# Patient Record
Sex: Male | Born: 1985 | Race: White | Hispanic: No | Marital: Married | Smoking: Never smoker
Health system: Southern US, Community
[De-identification: ages and names within clinical notes are randomized; demographics above are authoritative.]

---

## 2018-08-09 ENCOUNTER — Other Ambulatory Visit: Payer: Self-pay

## 2018-08-09 ENCOUNTER — Emergency Department (HOSPITAL_BASED_OUTPATIENT_CLINIC_OR_DEPARTMENT_OTHER): Payer: 59

## 2018-08-09 ENCOUNTER — Encounter (HOSPITAL_BASED_OUTPATIENT_CLINIC_OR_DEPARTMENT_OTHER): Payer: Self-pay | Admitting: *Deleted

## 2018-08-09 ENCOUNTER — Emergency Department (HOSPITAL_BASED_OUTPATIENT_CLINIC_OR_DEPARTMENT_OTHER)
Admission: EM | Admit: 2018-08-09 | Discharge: 2018-08-10 | Disposition: A | Discharge status: Home / Self Care | Payer: 59 | Attending: Emergency Medicine | Admitting: Emergency Medicine

## 2018-08-09 DIAGNOSIS — Y998 Other external cause status: Secondary | ICD-10-CM | POA: Diagnosis not present

## 2018-08-09 DIAGNOSIS — Y93H3 Activity, building and construction: Secondary | ICD-10-CM | POA: Diagnosis not present

## 2018-08-09 DIAGNOSIS — S61213A Laceration without foreign body of left middle finger without damage to nail, initial encounter: Secondary | ICD-10-CM | POA: Diagnosis present

## 2018-08-09 DIAGNOSIS — Z23 Encounter for immunization: Secondary | ICD-10-CM | POA: Insufficient documentation

## 2018-08-09 DIAGNOSIS — W458XXA Other foreign body or object entering through skin, initial encounter: Secondary | ICD-10-CM | POA: Insufficient documentation

## 2018-08-09 DIAGNOSIS — Y929 Unspecified place or not applicable: Secondary | ICD-10-CM | POA: Insufficient documentation

## 2018-08-09 DIAGNOSIS — Y999 Unspecified external cause status: Secondary | ICD-10-CM | POA: Insufficient documentation

## 2018-08-09 MED ORDER — LIDOCAINE HCL 2 % IJ SOLN
10.0000 mL | Freq: Once | INTRAMUSCULAR | Status: AC
  Administered 2018-08-10: 200 mg
  Filled 2018-08-09: qty 20

## 2018-08-09 NOTE — ED Provider Notes (Signed)
[place] EMERGENCY DEPARTMENT Provider Note   CSN: [id number] Arrival date & time: 08/09/18  2151    History   Chief Complaint Chief Complaint  Patient presents with  . Finger Injury    HPI Joseph Herman is a 33 y.o. male.     Patient presents to the emergency department for evaluation of left middle finger injury.  Patient reports that he screwed a deck screw through a piece of wood and it came out the back and into the tip of his middle finger.  Patient complains of bleeding that was controlled with direct pressure and mild pain.  No numbness, tingling or difficulty moving the finger.     History reviewed. No pertinent past medical history.  There are no active problems to display for this patient.   History reviewed. No pertinent surgical history.      Home Medications    Prior to Admission medications   Not on File    Family History No family history on file.  Social History Social History   Tobacco Use  . Smoking status: Never Smoker  . Smokeless tobacco: Never Used  Substance Use Topics  . Alcohol use: Not Currently  . Drug use: Never     Allergies   Patient has no known allergies.   Review of Systems Review of Systems  Skin: Positive for wound.  Neurological: Negative.      Physical Exam Updated Vital Signs BP 139/78   Pulse 80   Temp 98.5 F (36.9 C) (Oral)   Resp 14   Ht 5\' 10"  (1.778 m)   Wt 79.4 kg   SpO2 98%   BMI 25.11 kg/m   Physical Exam Constitutional:      Appearance: Normal appearance.  Musculoskeletal:     Left hand: He exhibits tenderness (Distal middle finger) and laceration (Distal middle finger). He exhibits normal range of motion and normal capillary refill. Normal sensation noted. Normal strength noted.  Skin:    Capillary Refill: Capillary refill takes less than 2 seconds.     Findings: Laceration present. No erythema.  Neurological:     Mental Status: He is alert.     Sensory:  Sensation is intact.     Motor: Motor function is intact.      ED Treatments / Results  Labs (all labs ordered are listed, but only abnormal results are displayed) Labs Reviewed - No data to display  EKG None  Radiology Dg Finger Middle Left  Result Date: 08/09/2018 CLINICAL DATA:  Injury with screw EXAM: LEFT MIDDLE FINGER 2+V COMPARISON:  None. FINDINGS: No fracture or dislocation is seen. The joint spaces are preserved. Soft tissue injury/laceration along the ventral aspect of the distal 1st digit. No radiopaque foreign body is seen. IMPRESSION: Soft tissue injury/laceration along the distal 1st digit. No fracture, dislocation, or radiopaque foreign body is seen. Electronically Signed   By: Julian Hy M.D.   On: 08/09/2018 22:36    Procedures .Marland KitchenLaceration Repair  Date/Time: 08/10/2018 12:50 AM Performed by: Orpah Greek, MD Authorized by: Orpah Greek, MD   Consent:    Consent obtained:  Verbal   Consent given by:  Patient   Risks discussed:  Infection, pain and poor wound healing Universal protocol:    Procedure explained and questions answered to patient or proxy's satisfaction: yes     Site/side marked: yes     Immediately prior to procedure, a time out was called: yes     Patient identity confirmed:  Verbally with patient Anesthesia (see MAR for exact dosages):    Anesthesia method:  Local infiltration   Local anesthetic:  Lidocaine 2% w/o epi Laceration details:    Location:  Finger   Finger location:  L long finger   Length (cm):  1 Repair type:    Repair type:  Simple Exploration:    Hemostasis achieved with:  Direct pressure   Wound exploration: wound explored through full range of motion     Contaminated: no   Treatment:    Area cleansed with:  Betadine   Irrigation solution:  Sterile saline   Irrigation method:  Syringe Skin repair:    Repair method:  Sutures   Suture size:  4-0   Suture material:  Nylon   Number of  sutures:  4 Approximation:    Approximation:  Close Post-procedure details:    Dressing:  Open (no dressing)   Patient tolerance of procedure:  Tolerated well, no immediate complications   (including critical care time)  Medications Ordered in ED Medications  lidocaine (XYLOCAINE) 2 % (with pres) injection 200 mg (has no administration in time range)     Initial Impression / Assessment and Plan / ED Course  I have reviewed the triage vital signs and the nursing notes.  Pertinent labs & imaging results that were available during my care of the patient were reviewed by me and considered in my medical decision making (see chart for details).          Final Clinical Impressions(s) / ED Diagnoses   Final diagnoses:  Laceration of left middle finger without foreign body without damage to nail, initial encounter    ED Discharge Orders    None       , Canary Brimhristopher J, MD 08/10/18 [phone]

## 2018-08-09 NOTE — ED Triage Notes (Signed)
He drilled a deck screw into the tip of his left middle finger. Bleeding controlled.

## 2018-08-10 MED ORDER — TETANUS-DIPHTH-ACELL PERTUSSIS 5-2.5-18.5 LF-MCG/0.5 IM SUSP
0.5000 mL | Freq: Once | INTRAMUSCULAR | Status: AC
  Administered 2018-08-10: 0.5 mL via INTRAMUSCULAR
  Filled 2018-08-10: qty 0.5

## 2018-08-10 NOTE — ED Notes (Signed)
Lidocaine and suture cart at bedside. Provider aware.

## 2018-08-10 NOTE — ED Notes (Signed)
Provider at bedside

## 2018-08-10 NOTE — Discharge Instructions (Addendum)
Sutures need to be removed in 7 to 10 days.  Follow-up with your primary doctor or urgent care for removal.  Watch for signs of infection, if they occur return to ER.

## 2020-04-29 IMAGING — DX LEFT MIDDLE FINGER 2+V
3 series · 3 of 3 positions shown · non-contrast
Comparison: None.

CLINICAL DATA: Injury with screw

EXAM:
LEFT MIDDLE FINGER 2+V

[finger ap]
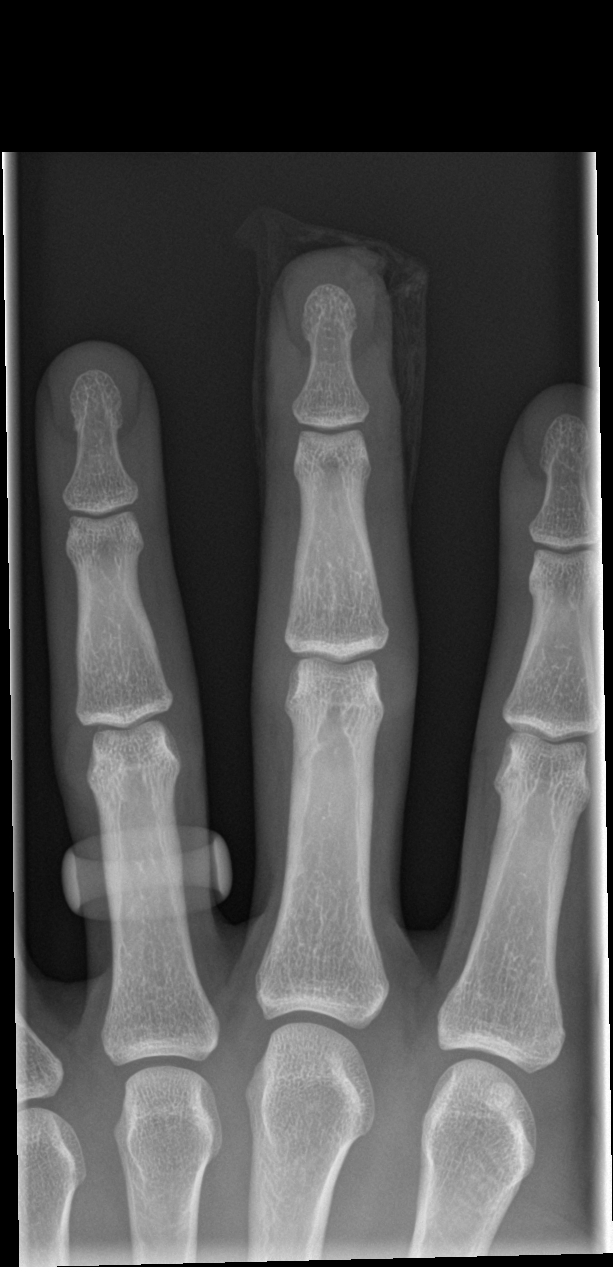

[finger obl]
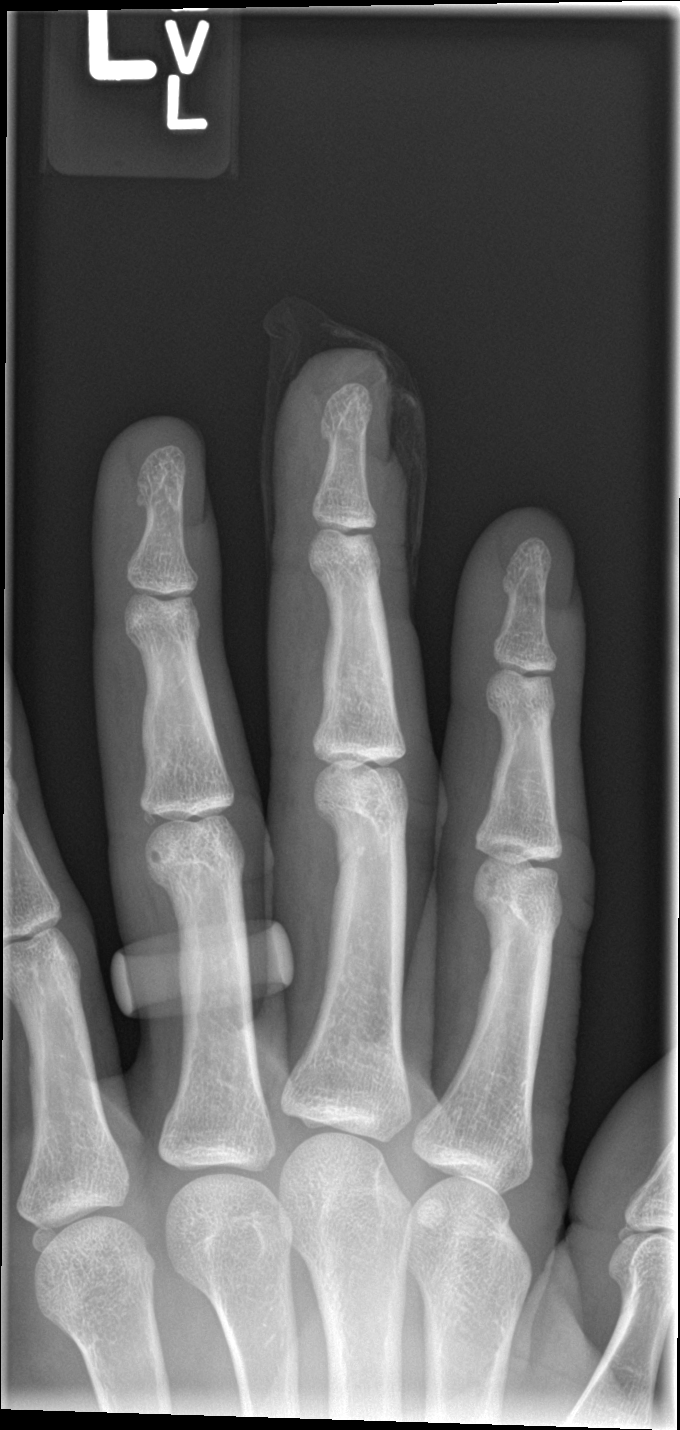

[finger lat]
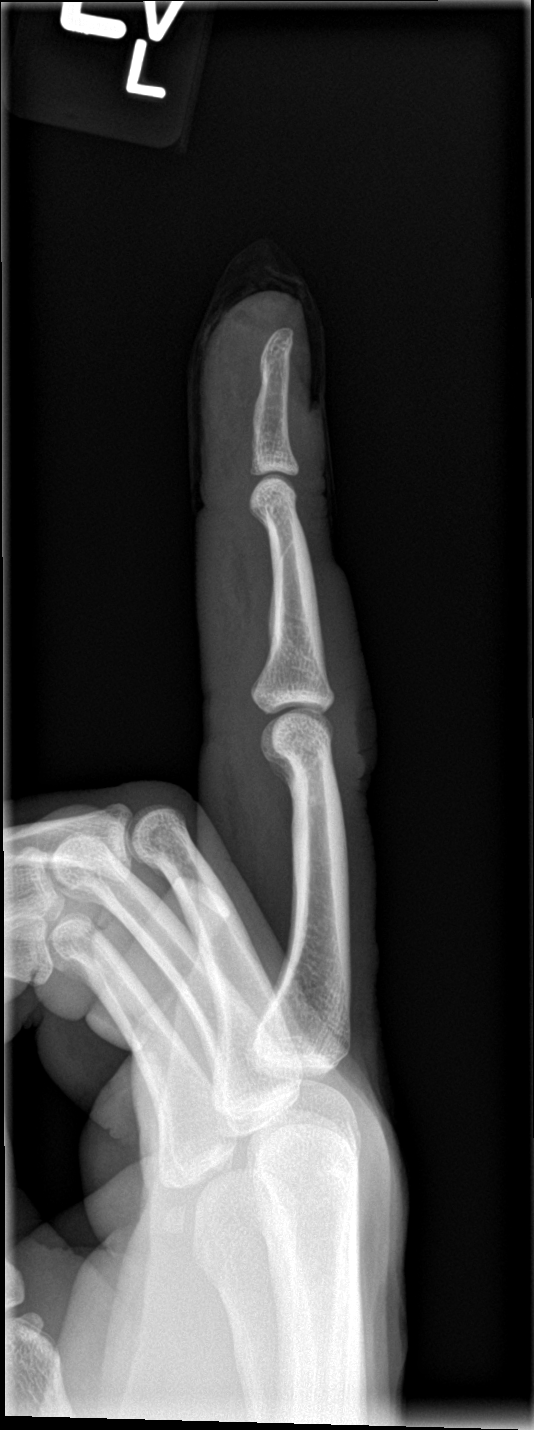

[3 of 3 positions shown; findings below may reference images not displayed]

FINDINGS: No fracture or dislocation is seen.

The joint spaces are preserved.

Soft tissue injury/laceration along the ventral aspect of the distal
1st digit. No radiopaque foreign body is seen.
IMPRESSION: Soft tissue injury/laceration along the distal 1st digit.

No fracture, dislocation, or radiopaque foreign body is seen.
